# Patient Record
Sex: Female | Born: 1995
Health system: Southern US, Community
[De-identification: ages and names within clinical notes are randomized; demographics above are authoritative.]

## PROBLEM LIST (undated history)

## (undated) HISTORY — PX: TONSILLECTOMY: SUR1361

---

## 2015-04-28 ENCOUNTER — Emergency Department (HOSPITAL_BASED_OUTPATIENT_CLINIC_OR_DEPARTMENT_OTHER)
Admission: EM | Admit: 2015-04-28 | Discharge: 2015-04-28 | Disposition: A | Discharge status: Home / Self Care | Payer: Medicaid Other | Attending: Emergency Medicine | Admitting: Emergency Medicine

## 2015-04-28 ENCOUNTER — Encounter (HOSPITAL_BASED_OUTPATIENT_CLINIC_OR_DEPARTMENT_OTHER): Payer: Self-pay | Admitting: *Deleted

## 2015-04-28 DIAGNOSIS — O9989 Other specified diseases and conditions complicating pregnancy, childbirth and the puerperium: Secondary | ICD-10-CM | POA: Diagnosis not present

## 2015-04-28 DIAGNOSIS — O99112 Other diseases of the blood and blood-forming organs and certain disorders involving the immune mechanism complicating pregnancy, second trimester: Secondary | ICD-10-CM | POA: Diagnosis not present

## 2015-04-28 DIAGNOSIS — D72829 Elevated white blood cell count, unspecified: Secondary | ICD-10-CM | POA: Diagnosis not present

## 2015-04-28 DIAGNOSIS — R51 Headache: Secondary | ICD-10-CM | POA: Diagnosis not present

## 2015-04-28 DIAGNOSIS — R1030 Lower abdominal pain, unspecified: Secondary | ICD-10-CM | POA: Diagnosis not present

## 2015-04-28 DIAGNOSIS — O212 Late vomiting of pregnancy: Secondary | ICD-10-CM | POA: Diagnosis not present

## 2015-04-28 DIAGNOSIS — Z3A24 24 weeks gestation of pregnancy: Secondary | ICD-10-CM | POA: Insufficient documentation

## 2015-04-28 DIAGNOSIS — O2342 Unspecified infection of urinary tract in pregnancy, second trimester: Secondary | ICD-10-CM | POA: Diagnosis not present

## 2015-04-28 DIAGNOSIS — O26899 Other specified pregnancy related conditions, unspecified trimester: Secondary | ICD-10-CM

## 2015-04-28 DIAGNOSIS — R109 Unspecified abdominal pain: Secondary | ICD-10-CM

## 2015-04-28 DIAGNOSIS — R519 Headache, unspecified: Secondary | ICD-10-CM

## 2015-04-28 LAB — COMPREHENSIVE METABOLIC PANEL
ALT: 13 U/L — AB (ref 14–54)
AST: 16 U/L (ref 15–41)
Albumin: 3 g/dL — ABNORMAL LOW (ref 3.5–5.0)
Alkaline Phosphatase: 76 U/L (ref 38–126)
Anion gap: 6 (ref 5–15)
BUN: 8 mg/dL (ref 6–20)
CHLORIDE: 109 mmol/L (ref 101–111)
CO2: 24 mmol/L (ref 22–32)
CREATININE: 0.64 mg/dL (ref 0.44–1.00)
Calcium: 8.6 mg/dL — ABNORMAL LOW (ref 8.9–10.3)
GFR calc non Af Amer: >60 mL/min (ref >60)
Glucose, Bld: 100 mg/dL — ABNORMAL HIGH (ref 65–99)
Potassium: 3.6 mmol/L (ref 3.5–5.1)
SODIUM: 139 mmol/L (ref 135–145)
Total Bilirubin: 0.2 mg/dL — ABNORMAL LOW (ref 0.3–1.2)
Total Protein: 6.5 g/dL (ref 6.5–8.1)

## 2015-04-28 LAB — CBC WITH DIFFERENTIAL/PLATELET
BASOS ABS: 0 10*3/uL (ref 0.0–0.1)
BASOS PCT: 0 %
EOS ABS: 0.2 10*3/uL (ref 0.0–0.7)
EOS PCT: 1 %
HCT: 33 % — ABNORMAL LOW (ref 36.0–46.0)
Hemoglobin: 10.9 g/dL — ABNORMAL LOW (ref 12.0–15.0)
Lymphocytes Relative: 18 %
Lymphs Abs: 2.5 10*3/uL (ref 0.7–4.0)
MCH: 29 pg (ref 26.0–34.0)
MCHC: 33 g/dL (ref 30.0–36.0)
MCV: 87.8 fL (ref 78.0–100.0)
Monocytes Absolute: 0.6 10*3/uL (ref 0.1–1.0)
Monocytes Relative: 4 %
Neutro Abs: 10.7 10*3/uL — ABNORMAL HIGH (ref 1.7–7.7)
Neutrophils Relative %: 77 %
PLATELETS: 340 10*3/uL (ref 150–400)
RBC: 3.76 MIL/uL — AB (ref 3.87–5.11)
RDW: 13.2 % (ref 11.5–15.5)
WBC: 14 10*3/uL — AB (ref 4.0–10.5)

## 2015-04-28 LAB — URINALYSIS, ROUTINE W REFLEX MICROSCOPIC
Glucose, UA: NEGATIVE mg/dL
Hgb urine dipstick: NEGATIVE
Ketones, ur: 15 mg/dL — AB
NITRITE: NEGATIVE
PH: 6.5 (ref 5.0–8.0)
Protein, ur: NEGATIVE mg/dL
SPECIFIC GRAVITY, URINE: 1.029 (ref 1.005–1.030)

## 2015-04-28 LAB — URINE MICROSCOPIC-ADD ON: RBC / HPF: NONE SEEN RBC/hpf (ref 0–5)

## 2015-04-28 LAB — LIPASE, BLOOD: Lipase: 15 U/L (ref 11–51)

## 2015-04-28 MED ORDER — FOSFOMYCIN TROMETHAMINE 3 G PO PACK
3.0000 g | PACK | Freq: Once | ORAL | Status: AC
  Administered 2015-04-28: 3 g via ORAL
  Filled 2015-04-28: qty 3

## 2015-04-28 MED ORDER — ACETAMINOPHEN 325 MG PO TABS
650.0000 mg | ORAL_TABLET | Freq: Once | ORAL | Status: AC
  Administered 2015-04-28: 650 mg via ORAL
  Filled 2015-04-28: qty 2

## 2015-04-28 NOTE — ED Provider Notes (Signed)
CSN: [id number]     Arrival date & time 04/28/15  1846 History  By signing my name below, I, Bethel Born, attest that this documentation has been prepared under the direction and in the presence of Leta Baptist, MD. Electronically Signed: Bethel Born, ED Scribe. 04/28/2015. 9:04 PM  Chief Complaint  Patient presents with  . Abdominal Pain  . [redacted] weeks Pregnant    The history is provided by the patient. No language interpreter was used.   Shuntel [place] is a  20 y.o. G1 female who presents to the Emergency Department at approximately [redacted] weeks pregnant complaining of sharp, 9/10 in severity, mid abdominal pain with onset 2 days ago. Pt states that she had 1 episode of emesis yesterday.She has frontal headaches and mild intermittent lower abdominal cramping that have been ongoing throughout her pregnancy with no recent change.  Pt denies vaginal discharge, leakage of fluid,  fever, chills, change in urinary pattern, weakness, numbness, tingling, and vision change. Pt states that apart from 2 UTIs her pregnancy has been uncomplicated. She last saw her OB in [place] on 04/03/15 and has a scheduled appointment on 05/01/15.  History reviewed. No pertinent past medical history. Past Surgical History  Procedure Laterality Date  . Tonsillectomy     No family history on file. Social History  Substance Use Topics  . Smoking status: Never Smoker   . Smokeless tobacco: None  . Alcohol Use: No   OB History    Gravida Para Term Preterm AB TAB SAB Ectopic Multiple Living   1              Review of Systems  Eyes: Negative for visual disturbance.  Gastrointestinal: Positive for vomiting and abdominal pain.  Genitourinary: Negative for frequency, vaginal bleeding and vaginal discharge.  Neurological: Positive for headaches. Negative for weakness and numbness.  All other systems reviewed and are negative.  Allergies  Prozac Home Medications   Prior to Admission medications   Not on  File   BP 115/64 mmHg  Pulse 107  Temp(Src) 98.4 F (36.9 C) (Oral)  Resp 20  Ht 5\' 5"  (1.651 m)  Wt 260 lb (117.935 kg)  BMI 43.27 kg/m2  SpO2 99%  LMP 11/18/2014 Physical Exam  Constitutional: She is oriented to person, place, and time. She appears well-developed and well-nourished. No distress.  HENT:  Head: Normocephalic and atraumatic.  Eyes: EOM are normal.  Neck: Normal range of motion.  Cardiovascular: Normal rate, regular rhythm and normal heart sounds.   Pulmonary/Chest: Effort normal and breath sounds normal.  Abdominal: Soft. She exhibits no distension. There is tenderness. There is no rebound.  Gravid abdomen with some tenderness left of midline above the umbilicus. Abdomen otherwise soft without rebound.   Musculoskeletal: Normal range of motion.  Neurological: She is alert and oriented to person, place, and time.  Skin: Skin is warm and dry.  Psychiatric: She has a normal mood and affect. Judgment normal.  Nursing note and vitals reviewed.   ED Course  Procedures (including critical care time) DIAGNOSTIC STUDIES: Oxygen Saturation is 99% on RA,  normal by my interpretation.    COORDINATION OF CARE: 8:14 PM Discussed treatment plan which includes lab work and Tylenol with pt at bedside and pt agreed to plan.  Labs Review Labs Reviewed  URINALYSIS, ROUTINE W REFLEX MICROSCOPIC (NOT AT [facility]) - Abnormal; Notable for the following:    Color, Urine AMBER (*)    APPearance CLOUDY (*)    Bilirubin Urine  SMALL (*)    Ketones, ur 15 (*)    Leukocytes, UA SMALL (*)    All other components within normal limits  URINE MICROSCOPIC-ADD ON - Abnormal; Notable for the following:    Squamous Epithelial / LPF 6-30 (*)    Bacteria, UA RARE (*)    All other components within normal limits  CBC WITH DIFFERENTIAL/PLATELET - Abnormal; Notable for the following:    WBC 14.0 (*)    RBC 3.76 (*)    Hemoglobin 10.9 (*)    HCT 33.0 (*)    Neutro Abs 10.7 (*)    All other  components within normal limits  COMPREHENSIVE METABOLIC PANEL - Abnormal; Notable for the following:    Glucose, Bld 100 (*)    Calcium 8.6 (*)    Albumin 3.0 (*)    ALT 13 (*)    Total Bilirubin 0.2 (*)    All other components within normal limits  URINE CULTURE  LIPASE, BLOOD    Imaging Review No results found. I have personally reviewed and evaluated these lab results as part of my medical decision-making.   EKG Interpretation None      MDM  Patient was seen and evaluated in stable condition.  Relatively benign physical examination.  No significant tenderness and area where patient was tender was left side of the abdomen above the umbilicus.  Labs were unremarkable other than UA with small amount of bacteria, leukocytosis on CBC.  Patient was given Fosfomycin for her UA.  Patient was kept on fetal monitor for extended period of time with normal tracing and OB cleared the patient from that stand point.  Patient felt comfortable with plan for discharge and stated that she has a follow up appointment on Monday which she will keep.  Strict return precautions given including rightsided abdominal pain.  Patient was discharged home in stable condition. Final diagnoses:  Headache, unspecified headache type  Abdominal pain during pregnancy    1. Abdominal pain  2. Headache  I personally performed the services described in this documentation, which was scribed in my presence. The recorded information has been reviewed and is accurate.   Leta BaptistEmily Roe Nguyen, MD 04/29/15 2225

## 2015-04-28 NOTE — ED Notes (Signed)
Abdominal pain. [redacted] weeks pregnant. Headaches. No vaginal discharge or leakage.

## 2015-04-28 NOTE — Discharge Instructions (Signed)
You were seen today for your abdominal pain and headache. The heart rate strip of your baby looked good. Your baby looked good on bedside ultrasound. Your laboratory results showed some elevation of her white blood cell count but this is nonspecific. At this time it does not appear that anything serious is causing your abdominal pain. Try to use a bland diet. Follow-up with your OB/GYN on Monday. Return with sudden severe pain or new right-sided abdominal pain.  Abdominal Pain During Pregnancy Abdominal pain is common in pregnancy. Most of the time, it does not cause harm. There are many causes of abdominal pain. Some causes are more serious than others. Some of the causes of abdominal pain in pregnancy are easily diagnosed. Occasionally, the diagnosis takes time to understand. Other times, the cause is not determined. Abdominal pain can be a sign that something is very wrong with the pregnancy, or the pain may have nothing to do with the pregnancy at all. For this reason, always tell your health care provider if you have any abdominal discomfort. HOME CARE INSTRUCTIONS  Monitor your abdominal pain for any changes. The following actions may help to alleviate any discomfort you are experiencing:  Do not have sexual intercourse or put anything in your vagina until your symptoms go away completely.  Get plenty of rest until your pain improves.  Drink clear fluids if you feel nauseous. Avoid solid food as long as you are uncomfortable or nauseous.  Only take over-the-counter or prescription medicine as directed by your health care provider.  Keep all follow-up appointments with your health care provider. SEEK IMMEDIATE MEDICAL CARE IF:  You are bleeding, leaking fluid, or passing tissue from the vagina.  You have increasing pain or cramping.  You have persistent vomiting.  You have painful or bloody urination.  You have a fever.  You notice a decrease in your baby's movements.  You have  extreme weakness or feel faint.  You have shortness of breath, with or without abdominal pain.  You develop a severe headache with abdominal pain.  You have abnormal vaginal discharge with abdominal pain.  You have persistent diarrhea.  You have abdominal pain that continues even after rest, or gets worse. MAKE SURE YOU:   Understand these instructions.  Will watch your condition.  Will get help right away if you are not doing well or get worse.   This information is not intended to replace advice given to you by your health care provider. Make sure you discuss any questions you have with your health care provider.   Document Released: 04/01/2005 Document Revised: 01/20/2013 Document Reviewed: 10/29/2012 Elsevier Interactive Patient Education 2016 [employer]  General Headache  A headache is pain or discomfort felt around the head or neck area. The specific cause of a headache may not be found. There are many causes and types of headaches. A few common ones are:  Tension headaches.  Migraine headaches.  Cluster headaches.  Chronic daily headaches. HOME CARE INSTRUCTIONS  Watch your condition for any changes. Take these steps to help with your condition: Managing Pain  Take over-the-counter and prescription medicines only as told by your health care provider.  Lie down in a dark, quiet room when you have a headache.  If directed, apply ice to the head and neck area:  Put ice in a plastic bag.  Place a towel between your skin and the bag.  Leave the ice on for 20 minutes, 2-3 times per day.  Use a  heating pad or hot shower to apply heat to the head and neck area as told by your health care provider.  Keep lights dim if bright lights bother you or make your headaches worse. Eating and Drinking  Eat meals on a regular schedule.  Limit alcohol use.  Decrease the amount of caffeine you drink, or stop drinking caffeine. General Instructions  Keep all  follow-up visits as told by your health care provider. This is important.  Keep a headache journal to help find out what may trigger your headaches. For example, write down:  What you eat and drink.  How much sleep you get.  Any change to your diet or medicines.  Try massage or other relaxation techniques.  Limit stress.  Sit up straight, and do not tense your muscles.  Do not use tobacco products, including cigarettes, chewing tobacco, or e-cigarettes. If you need help quitting, ask your health care provider.  Exercise regularly as told by your health care provider.  Sleep on a regular schedule. Get 7-9 hours of sleep, or the amount recommended by your health care provider. SEEK MEDICAL CARE IF:   Your symptoms are not helped by medicine.  You have a headache that is different from the usual headache.  You have nausea or you vomit.  You have a fever. SEEK IMMEDIATE MEDICAL CARE IF:   Your headache becomes severe.  You have repeated vomiting.  You have a stiff neck.  You have a loss of vision.  You have problems with speech.  You have pain in the eye or ear.  You have muscular weakness or loss of muscle control.  You lose your balance or have trouble walking.  You feel faint or pass out.  You have confusion.   This information is not intended to replace advice given to you by your health care provider. Make sure you discuss any questions you have with your health care provider.   Document Released: 04/01/2005 Document Revised: 12/21/2014 Document Reviewed: 07/25/2014 Elsevier Interactive Patient Education [employer]

## 2015-04-28 NOTE — ED Notes (Signed)
Spoke with Olegario MessierKathy, Rapid response OB RN.  Pt can be removed from the toco and fetal monitor.

## 2015-05-02 LAB — URINE CULTURE

## 2015-05-29 ENCOUNTER — Encounter (HOSPITAL_BASED_OUTPATIENT_CLINIC_OR_DEPARTMENT_OTHER): Payer: Self-pay | Admitting: *Deleted

## 2015-05-29 ENCOUNTER — Emergency Department (HOSPITAL_BASED_OUTPATIENT_CLINIC_OR_DEPARTMENT_OTHER)
Admission: EM | Admit: 2015-05-29 | Discharge: 2015-05-29 | Disposition: A | Discharge status: Home / Self Care | Payer: Medicaid Other | Attending: Emergency Medicine | Admitting: Emergency Medicine

## 2015-05-29 DIAGNOSIS — R42 Dizziness and giddiness: Secondary | ICD-10-CM | POA: Diagnosis not present

## 2015-05-29 DIAGNOSIS — R1030 Lower abdominal pain, unspecified: Secondary | ICD-10-CM | POA: Diagnosis not present

## 2015-05-29 DIAGNOSIS — O9989 Other specified diseases and conditions complicating pregnancy, childbirth and the puerperium: Secondary | ICD-10-CM | POA: Insufficient documentation

## 2015-05-29 DIAGNOSIS — Z3A28 28 weeks gestation of pregnancy: Secondary | ICD-10-CM | POA: Insufficient documentation

## 2015-05-29 DIAGNOSIS — N898 Other specified noninflammatory disorders of vagina: Secondary | ICD-10-CM | POA: Insufficient documentation

## 2015-05-29 DIAGNOSIS — Z3493 Encounter for supervision of normal pregnancy, unspecified, third trimester: Secondary | ICD-10-CM

## 2015-05-29 DIAGNOSIS — R109 Unspecified abdominal pain: Secondary | ICD-10-CM

## 2015-05-29 LAB — URINALYSIS, ROUTINE W REFLEX MICROSCOPIC
Glucose, UA: NEGATIVE mg/dL
HGB URINE DIPSTICK: NEGATIVE
KETONES UR: 15 mg/dL — AB
NITRITE: NEGATIVE
PROTEIN: 30 mg/dL — AB
Specific Gravity, Urine: 1.028 (ref 1.005–1.030)
pH: 6 (ref 5.0–8.0)

## 2015-05-29 LAB — CBC WITH DIFFERENTIAL/PLATELET
BASOS ABS: 0 10*3/uL (ref 0.0–0.1)
Basophils Relative: 0 %
EOS PCT: 1 %
Eosinophils Absolute: 0.1 10*3/uL (ref 0.0–0.7)
HEMATOCRIT: 32.8 % — AB (ref 36.0–46.0)
Hemoglobin: 10.5 g/dL — ABNORMAL LOW (ref 12.0–15.0)
LYMPHS ABS: 2.3 10*3/uL (ref 0.7–4.0)
LYMPHS PCT: 17 %
MCH: 28.5 pg (ref 26.0–34.0)
MCHC: 32 g/dL (ref 30.0–36.0)
MCV: 89.1 fL (ref 78.0–100.0)
MONO ABS: 0.6 10*3/uL (ref 0.1–1.0)
Monocytes Relative: 5 %
NEUTROS ABS: 10.7 10*3/uL — AB (ref 1.7–7.7)
Neutrophils Relative %: 77 %
PLATELETS: 345 10*3/uL (ref 150–400)
RBC: 3.68 MIL/uL — ABNORMAL LOW (ref 3.87–5.11)
RDW: 12.9 % (ref 11.5–15.5)
WBC: 13.7 10*3/uL — ABNORMAL HIGH (ref 4.0–10.5)

## 2015-05-29 LAB — URINE MICROSCOPIC-ADD ON

## 2015-05-29 LAB — COMPREHENSIVE METABOLIC PANEL
ALT: 13 U/L — AB (ref 14–54)
AST: 14 U/L — AB (ref 15–41)
Albumin: 3 g/dL — ABNORMAL LOW (ref 3.5–5.0)
Alkaline Phosphatase: 91 U/L (ref 38–126)
Anion gap: 8 (ref 5–15)
BILIRUBIN TOTAL: 0.4 mg/dL (ref 0.3–1.2)
BUN: 8 mg/dL (ref 6–20)
CO2: 24 mmol/L (ref 22–32)
CREATININE: 0.66 mg/dL (ref 0.44–1.00)
Calcium: 8.8 mg/dL — ABNORMAL LOW (ref 8.9–10.3)
Chloride: 108 mmol/L (ref 101–111)
Glucose, Bld: 96 mg/dL (ref 65–99)
Potassium: 3.5 mmol/L (ref 3.5–5.1)
Sodium: 140 mmol/L (ref 135–145)
TOTAL PROTEIN: 6.7 g/dL (ref 6.5–8.1)

## 2015-05-29 NOTE — ED Notes (Signed)
OB rapid response called and made aware of the patient -

## 2015-05-29 NOTE — Progress Notes (Deleted)
1846  Received call regarding this 19yo G1P0 .[redacted]wks GA in with complaints of lower abdominal pressure and light vaginal discharge.  Patient receives OB care at [facility].  Patient arrived in ED @ 1737.

## 2015-05-29 NOTE — Progress Notes (Signed)
1846  Received call regarding this 19yo G1P0 .[redacted]wks GA in with complaints of lower abdominal pressure and light vaginal discharge.  Patient receives care at [facility].  Patient arrived in ED @ 1737.  1854  O[facility] RN advised ED RN FHR not tracing.  She will adjust.

## 2015-05-29 NOTE — ED Notes (Signed)
Patient on fetal heart monitor

## 2015-05-29 NOTE — ED Notes (Signed)
Lower abdominal pressure.she is [redacted] weeks pregnant. White vaginal discharge.

## 2015-05-29 NOTE — ED Notes (Signed)
Pt d/c home with note for work

## 2015-05-29 NOTE — Discharge Instructions (Signed)
Follow-up with your OB as scheduled in 3 days.  Return to the ER if you develop bleeding, worsening pain, high fever, or other new and concerning symptoms.  You do have a slight amount of protein in your urine. Please follow this up with your OB at your appointment.   Abdominal Pain, Adult Many things can cause abdominal pain. Usually, abdominal pain is not caused by a disease and will improve without treatment. It can often be observed and treated at home. Your health care provider will do a physical exam and possibly order blood tests and X-rays to help determine the seriousness of your pain. However, in many cases, more time must pass before a clear cause of the pain can be found. Before that point, your health care provider may not know if you need more testing or further treatment. HOME CARE INSTRUCTIONS Monitor your abdominal pain for any changes. The following actions may help to alleviate any discomfort you are experiencing:  Only take over-the-counter or prescription medicines as directed by your health care provider.  Do not take laxatives unless directed to do so by your health care provider.  Try a clear liquid diet (broth, tea, or water) as directed by your health care provider. Slowly move to a bland diet as tolerated. SEEK MEDICAL CARE IF:  You have unexplained abdominal pain.  You have abdominal pain associated with nausea or diarrhea.  You have pain when you urinate or have a bowel movement.  You experience abdominal pain that wakes you in the night.  You have abdominal pain that is worsened or improved by eating food.  You have abdominal pain that is worsened with eating fatty foods.  You have a fever. SEEK IMMEDIATE MEDICAL CARE IF:  Your pain does not go away within 2 hours.  You keep throwing up (vomiting).  Your pain is felt only in portions of the abdomen, such as the right side or the left lower portion of the abdomen.  You pass bloody or black tarry  stools. MAKE SURE YOU:  Understand these instructions.  Will watch your condition.  Will get help right away if you are not doing well or get worse.   This information is not intended to replace advice given to you by your health care provider. Make sure you discuss any questions you have with your health care provider.   Document Released: 01/09/2005 Document Revised: 12/21/2014 Document Reviewed: 12/09/2012 Elsevier Interactive Patient Education [employer]

## 2015-05-29 NOTE — ED Provider Notes (Signed)
CSN: [id number]     Arrival date & time 05/29/15  1737 History  By signing my name below, I, Linus Galas, attest that this documentation has been prepared under the direction and in the presence of Geoffery Lyons, MD. Electronically Signed: Linus Galas, ED Scribe. 05/29/2015. 5:59 PM.   Chief Complaint  Patient presents with  . Abdominal Pain   The history is provided by the patient. No language interpreter was used.   HPI Comments: Veronica Solis is a 20 y.o. female who is 28 weeks into her first pregnancy with no PMHx presents to the Emergency Department complaining of lower abdominal pain that began 4:30 PM, PTA. Pt also reports white vaginal discharge and lightheadedness. Pt denies any BLE swelling, vaginal bleeding or any other sx at this time. Pt denies any falls. Pt reports she last had an OB check up one month ago with no abnormalities.  History reviewed. No pertinent past medical history. Past Surgical History  Procedure Laterality Date  . Tonsillectomy     No family history on file. Social History  Substance Use Topics  . Smoking status: Never Smoker   . Smokeless tobacco: None  . Alcohol Use: No   OB History    Gravida Para Term Preterm AB TAB SAB Ectopic Multiple Living   1              Review of Systems  Gastrointestinal: Positive for abdominal pain.  Genitourinary: Positive for vaginal discharge (white). Negative for vaginal bleeding.  Neurological: Positive for light-headedness.  All other systems reviewed and are negative.   Allergies  Prozac  Home Medications   Prior to Admission medications   Not on File   BP 98/42 mmHg  Pulse 112  Temp(Src) 98.3 F (36.8 C) (Oral)  Resp 16  Ht  (1.651 m)  Wt 252 lb (114.306 kg)  BMI 41.93 kg/m2  SpO2 99%  LMP 11/18/2014   Physical Exam  Constitutional: She is oriented to person, place, and time. She appears well-developed and well-nourished. No distress.  HENT:  Head: Normocephalic and atraumatic.   Mouth/Throat: Oropharynx is clear and moist. No oropharyngeal exudate.  Eyes: Conjunctivae and EOM are normal. Pupils are equal, round, and reactive to light.  Neck: Normal range of motion. Neck supple.  No meningismus.  Cardiovascular: Normal rate, regular rhythm, normal heart sounds and intact distal pulses.   No murmur heard. Pulmonary/Chest: Effort normal and breath sounds normal. No respiratory distress.  Abdominal: Soft. There is no tenderness. There is no rebound and no guarding.  fundal height consistent with stated gestational age. Mild TTP in the suprapubic region.   Musculoskeletal: Normal range of motion. She exhibits no edema or tenderness.  Neurological: She is alert and oriented to person, place, and time. No cranial nerve deficit. She exhibits normal muscle tone. Coordination normal.  No ataxia on finger to nose bilaterally. No pronator drift. 5/5 strength throughout. CN 2-12 intact.Equal grip strength. Sensation intact.   Skin: Skin is warm.  Psychiatric: She has a normal mood and affect. Her behavior is normal.  Nursing note and vitals reviewed.   ED Course  Procedures  DIAGNOSTIC STUDIES: Oxygen Saturation is 99% on room air, normal by my interpretation.    COORDINATION OF CARE: 5:58 PM Will order blood work and urinalysis. Discussed treatment plan with pt at bedside and pt agreed to plan.  Labs Review Labs Reviewed - No data to display  Imaging Review No results found. I have personally reviewed and evaluated  these images and lab results as part of my medical decision-making.    MDM   Final diagnoses:  None    Patient is [redacted] weeks pregnant and presents with complaints of lower abdominal discomfort. She denies any bleeding or leakage of fluid. She has been observed on the monitor here and has no evidence for contractions. Urinalysis does reveal slight protein, however her blood pressures are within normal limits and there is no leg edema. She has an  appointment to see her OB in 3 days. I have advised her to mention this to her. She is to return as needed for any problems.  I personally performed the services described in this documentation, which was scribed in my presence. The recorded information has been reviewed and is accurate.       Geoffery Lyons, MD 05/29/15 2029

## 2015-05-29 NOTE — ED Notes (Signed)
Call to OB rapid response TOCO tracing has been WNL and has been review by their physician.

## 2015-07-30 ENCOUNTER — Emergency Department (HOSPITAL_BASED_OUTPATIENT_CLINIC_OR_DEPARTMENT_OTHER)
Admission: EM | Admit: 2015-07-30 | Discharge: 2015-07-30 | Disposition: A | Discharge status: Home / Self Care | Payer: Medicaid Other | Attending: Emergency Medicine | Admitting: Emergency Medicine

## 2015-07-30 ENCOUNTER — Encounter (HOSPITAL_BASED_OUTPATIENT_CLINIC_OR_DEPARTMENT_OTHER): Payer: Self-pay | Admitting: Emergency Medicine

## 2015-07-30 DIAGNOSIS — R21 Rash and other nonspecific skin eruption: Secondary | ICD-10-CM

## 2015-07-30 MED ORDER — ACETAMINOPHEN 325 MG PO TABS
ORAL_TABLET | ORAL | Status: AC
  Filled 2015-07-30: qty 1

## 2015-07-30 MED ORDER — ACETAMINOPHEN 325 MG PO TABS
650.0000 mg | ORAL_TABLET | Freq: Once | ORAL | Status: AC
  Administered 2015-07-30: 650 mg via ORAL
  Filled 2015-07-30: qty 2

## 2015-07-30 MED ORDER — HYDROCORTISONE 1 % EX CREA: 1.0000 "application " | TOPICAL_CREAM | Freq: Two times a day (BID) | CUTANEOUS | Status: AC

## 2015-07-30 NOTE — Discharge Instructions (Signed)
1. Medications: hydrocortisone craem, usual home medications 2. Treatment: rest, drink plenty of fluids 3. Follow Up: please followup with your OB tomorrow for discussion of your diagnoses and further evaluation after today's visit; if you do not have a primary care doctor use the phone number listed in your discharge paperwork to find one; please return to the ER for fever, increased redness or pain, new or worsening symptoms

## 2015-07-30 NOTE — ED Provider Notes (Signed)
CSN: [id number]     Arrival date & time 07/30/15  1900 History   First MD Initiated Contact with Patient 07/30/15 2028     Chief Complaint  Patient presents with  . Rash    HPI   Veronica Solis is a 20 y.o. female who presents to the ED with rash to the posterior aspect of her neck and upper back. She states she noticed this last night. She denies exacerbating or alleviating factors. She has not tried anything for symptom relief. She denies fever, chills, shortness of breath, abdominal pain, nausea, vomiting, vaginal bleeding. She denies history of similar symptoms. She denies new medications, foods, lotions, detergents, soap, environmental exposures. She states she is [redacted] weeks pregnant.   History reviewed. No pertinent past medical history. Past Surgical History  Procedure Laterality Date  . Tonsillectomy     History reviewed. No pertinent family history. Social History  Substance Use Topics  . Smoking status: Never Smoker   . Smokeless tobacco: None  . Alcohol Use: No   OB History    Gravida Para Term Preterm AB TAB SAB Ectopic Multiple Living   1               Review of Systems  Constitutional: Negative for fever and chills.  Respiratory: Negative for shortness of breath.   Skin: Positive for rash.      Allergies  Prozac  Home Medications   Prior to Admission medications   Not on File    BP 116/56 mmHg  Pulse 94  Temp(Src) 98.7 F (37.1 C) (Oral)  Resp 20  Ht 5\' 5"  (1.651 m)  Wt 116.574 kg  BMI 42.77 kg/m2  SpO2 100%  LMP 11/18/2014 Physical Exam  Constitutional: She is oriented to person, place, and time. She appears well-developed and well-nourished. No distress.  HENT:  Head: Normocephalic and atraumatic.  Right Ear: External ear normal.  Left Ear: External ear normal.  Nose: Nose normal.  Mouth/Throat: Uvula is midline, oropharynx is clear and moist and mucous membranes are normal.  Eyes: Conjunctivae, EOM and lids are normal. Pupils are equal,  round, and reactive to light. Right eye exhibits no discharge. Left eye exhibits no discharge. No scleral icterus.  Neck: Normal range of motion. Neck supple.  Cardiovascular: Normal rate, regular rhythm, normal heart sounds, intact distal pulses and normal pulses.   Pulmonary/Chest: Effort normal and breath sounds normal. No respiratory distress. She has no wheezes. She has no rales.  Abdominal: Soft. Normal appearance and bowel sounds are normal. She exhibits no distension and no mass. There is no tenderness. There is no rigidity, no rebound and no guarding.  Musculoskeletal: Normal range of motion. She exhibits no edema or tenderness.  Neurological: She is alert and oriented to person, place, and time.  Skin: Skin is warm, dry and intact. No rash noted. She is not diaphoretic. No erythema. No pallor.     Mild erythema to posterior aspect of neck and upper back. No skin sloughing. No significant heat. No fluctuance. No vesicles.  Psychiatric: She has a normal mood and affect. Her speech is normal and behavior is normal.  Nursing note and vitals reviewed.   ED Course  Procedures (including critical care time)  Labs Review Labs Reviewed - No data to display  Imaging Review No results found.    EKG Interpretation None      MDM   Final diagnoses:  Rash    [age] year old female presents with rash to the posterior aspect  of her neck, which she noticed yesterday. Denies fever, chills, shortness of breath, abdominal pain, nausea, vomiting, vaginal bleeding. Denies new exposures to foods, medications, lotions/soap/detergents. Patient is afebrile. Vital signs stable. On exam, she has mild erythema to the posterior aspect of her neck and upper back. No skin sloughing. No significant heat. No fluctuance. No vesicles. Patient discussed with Dr. Anitra Lauth. No evidence of cellulitis, abscess, shingles. Patient is nontoxic and well-appearing, feel she is stable for discharge at this time. Will  treat with topical hydrocortisone. Patient to follow up with her OB tomorrow. Strict return precautions discussed. Patient verbalizes her understanding and is in agreement with plan.  BP 116/56 mmHg  Pulse 94  Temp(Src) 98.7 F (37.1 C) (Oral)  Resp 20  Ht  (1.651 m)  Wt 116.574 kg  BMI 42.77 kg/m2  SpO2 100%  LMP 11/18/2014     Mady Gemma, PA-C 07/31/15 0454  Gwyneth Sprout, MD 08/02/15 1442

## 2015-07-30 NOTE — ED Notes (Signed)
Pt in c/o itchy red rash to posterior neck, denies new medications or products. Airway intact, NAD.

## 2016-03-02 ENCOUNTER — Encounter (HOSPITAL_COMMUNITY): Payer: Self-pay

## 2017-07-28 ENCOUNTER — Emergency Department (HOSPITAL_BASED_OUTPATIENT_CLINIC_OR_DEPARTMENT_OTHER)
Admission: EM | Admit: 2017-07-28 | Discharge: 2017-07-28 | Disposition: A | Discharge status: Home / Self Care | Payer: BLUE CROSS/BLUE SHIELD | Attending: Emergency Medicine | Admitting: Emergency Medicine

## 2017-07-28 ENCOUNTER — Other Ambulatory Visit: Payer: Self-pay

## 2017-07-28 ENCOUNTER — Encounter (HOSPITAL_BASED_OUTPATIENT_CLINIC_OR_DEPARTMENT_OTHER): Payer: Self-pay | Admitting: Emergency Medicine

## 2017-07-28 ENCOUNTER — Emergency Department (HOSPITAL_BASED_OUTPATIENT_CLINIC_OR_DEPARTMENT_OTHER): Payer: BLUE CROSS/BLUE SHIELD

## 2017-07-28 DIAGNOSIS — J111 Influenza due to unidentified influenza virus with other respiratory manifestations: Secondary | ICD-10-CM | POA: Diagnosis not present

## 2017-07-28 DIAGNOSIS — J029 Acute pharyngitis, unspecified: Secondary | ICD-10-CM | POA: Diagnosis present

## 2017-07-28 DIAGNOSIS — R6889 Other general symptoms and signs: Secondary | ICD-10-CM

## 2017-07-28 LAB — PREGNANCY, URINE: Preg Test, Ur: NEGATIVE

## 2017-07-28 LAB — URINALYSIS, ROUTINE W REFLEX MICROSCOPIC
Bilirubin Urine: NEGATIVE
Glucose, UA: NEGATIVE mg/dL
KETONES UR: 15 mg/dL — AB
LEUKOCYTES UA: NEGATIVE
NITRITE: NEGATIVE
Protein, ur: 30 mg/dL — AB
SPECIFIC GRAVITY, URINE: 1.025 (ref 1.005–1.030)
pH: 6 (ref 5.0–8.0)

## 2017-07-28 LAB — CBC WITH DIFFERENTIAL/PLATELET
Basophils Absolute: 0 10*3/uL (ref 0.0–0.1)
Basophils Relative: 0 %
Eosinophils Absolute: 0 10*3/uL (ref 0.0–0.7)
Eosinophils Relative: 0 %
HEMATOCRIT: 35.9 % — AB (ref 36.0–46.0)
Hemoglobin: 12.3 g/dL (ref 12.0–15.0)
LYMPHS ABS: 2 10*3/uL (ref 0.7–4.0)
LYMPHS PCT: 17 %
MCH: 28.9 pg (ref 26.0–34.0)
MCHC: 34.3 g/dL (ref 30.0–36.0)
MCV: 84.5 fL (ref 78.0–100.0)
Monocytes Absolute: 0.7 10*3/uL (ref 0.1–1.0)
Monocytes Relative: 6 %
NEUTROS ABS: 8.9 10*3/uL — AB (ref 1.7–7.7)
Neutrophils Relative %: 77 %
Platelets: 297 10*3/uL (ref 150–400)
RBC: 4.25 MIL/uL (ref 3.87–5.11)
RDW: 13.7 % (ref 11.5–15.5)
WBC: 11.6 10*3/uL — AB (ref 4.0–10.5)

## 2017-07-28 LAB — BASIC METABOLIC PANEL
ANION GAP: 8 (ref 5–15)
BUN: 12 mg/dL (ref 6–20)
CHLORIDE: 104 mmol/L (ref 101–111)
CO2: 22 mmol/L (ref 22–32)
Calcium: 8.4 mg/dL — ABNORMAL LOW (ref 8.9–10.3)
Creatinine, Ser: 0.89 mg/dL (ref 0.44–1.00)
GFR calc Af Amer: >60 mL/min (ref >60)
GLUCOSE: 150 mg/dL — AB (ref 65–99)
POTASSIUM: 3.4 mmol/L — AB (ref 3.5–5.1)
Sodium: 134 mmol/L — ABNORMAL LOW (ref 135–145)

## 2017-07-28 LAB — URINALYSIS, MICROSCOPIC (REFLEX)

## 2017-07-28 LAB — RAPID STREP SCREEN (MED CTR MEBANE ONLY): Streptococcus, Group A Screen (Direct): NEGATIVE

## 2017-07-28 MED ORDER — SODIUM CHLORIDE 0.9 % IV BOLUS
1000.0000 mL | Freq: Once | INTRAVENOUS | Status: AC
  Administered 2017-07-28: 1000 mL via INTRAVENOUS

## 2017-07-28 MED ORDER — KETOROLAC TROMETHAMINE 30 MG/ML IJ SOLN
30.0000 mg | Freq: Once | INTRAMUSCULAR | Status: AC
  Administered 2017-07-28: 30 mg via INTRAVENOUS

## 2017-07-28 MED ORDER — ACETAMINOPHEN 325 MG PO TABS
650.0000 mg | ORAL_TABLET | Freq: Once | ORAL | Status: AC | PRN
  Administered 2017-07-28: 650 mg via ORAL
  Filled 2017-07-28: qty 2

## 2017-07-28 MED ORDER — KETOROLAC TROMETHAMINE 30 MG/ML IJ SOLN
60.0000 mg | Freq: Once | INTRAMUSCULAR | Status: DC
  Filled 2017-07-28: qty 2

## 2017-07-28 NOTE — ED Notes (Signed)
Patient transported to X-ray 

## 2017-07-28 NOTE — ED Triage Notes (Addendum)
Pt c/o sore throat, HA, neck pain and chills since Saturday; Ibuprofen 400mg  at 1800

## 2017-07-28 NOTE — Discharge Instructions (Addendum)
Take tylenol and motrin around the clock to keep your fever and body aches down. Take over the counter cold and flu medications. Rest. Follow up as needed.

## 2017-07-28 NOTE — ED Provider Notes (Signed)
[facility] EMERGENCY DEPARTMENT Provider Note   CSN: [id number] Arrival date & time: 07/28/17  1947     History   Chief Complaint Chief Complaint  Patient presents with  . Sore Throat  . Neck Pain    HPI Veronica Solis is a 22 y.o. female.  HPI Veronica Solis is a 22 y.o. female presents to emergency department complaining of chills, headache, neck pain, cough, generalized weakness, some sore throat.  She states symptoms began yesterday.  She did not know she has a fever, temperature 103 here.  She does report neck pain but no neck stiffness.  She denies any photophobia.  She denies any nausea or vomiting.  No diarrhea.  No sick contacts.  No urinary symptoms.  She is currently on her menstrual cycle.  History reviewed. No pertinent past medical history.  There are no active problems to display for this patient.   Past Surgical History:  Procedure Laterality Date  . TONSILLECTOMY       OB History    Gravida  1   Para      Term      Preterm      AB      Living        SAB      TAB      Ectopic      Multiple      Live Births               Home Medications    Prior to Admission medications   Medication Sig Start Date End Date Taking? Authorizing Provider  hydrocortisone cream 1 % Apply 1 application topically 2 (two) times daily. Do not apply to face 07/30/15   Mady GemmaWestfall, Elizabeth C, PA-C    Family History No family history on file.  Social History Social History   Tobacco Use  . Smoking status: Never Smoker  . Smokeless tobacco: Never Used  Substance Use Topics  . Alcohol use: No  . Drug use: No     Allergies   Prozac [fluoxetine hcl]   Review of Systems Review of Systems  Constitutional: Positive for chills and fever.  HENT: Positive for sore throat. Negative for congestion.   Respiratory: Positive for cough. Negative for chest tightness and shortness of breath.   Cardiovascular: Negative for chest pain, palpitations  and leg swelling.  Gastrointestinal: Negative for abdominal pain, diarrhea, nausea and vomiting.  Genitourinary: Negative for dysuria, flank pain, pelvic pain, vaginal bleeding, vaginal discharge and vaginal pain.  Musculoskeletal: Positive for neck pain. Negative for arthralgias, myalgias and neck stiffness.  Skin: Negative for rash.  Neurological: Positive for weakness. Negative for dizziness and headaches.  All other systems reviewed and are negative.    Physical Exam Updated Vital Signs BP 133/90 (BP Location: Left Arm)   Pulse (!) 124   Temp (!) 103 F (39.4 C) (Oral)   Resp 18   Ht 5\' 6"  (1.676 m)   Wt (!) 146.5 kg (323 lb)   LMP 07/24/2017   SpO2 99%   BMI 52.13 kg/m   Physical Exam  Constitutional: She appears well-developed and well-nourished. No distress.  HENT:  Head: Normocephalic.  No nasal congestion.  Oropharynx is normal.  Eyes: Conjunctivae are normal.  Neck: Neck supple.  Full range of motion, able to touch chin to the chest.  Cardiovascular: Normal rate, regular rhythm and normal heart sounds.  Pulmonary/Chest: Effort normal and breath sounds normal. No respiratory distress. She has no wheezes. She  has no rales.  Abdominal: Soft. Bowel sounds are normal. She exhibits no distension. There is no tenderness. There is no rebound.  Musculoskeletal: She exhibits no edema.  Neurological: She is alert.  Skin: Skin is warm and dry.  Psychiatric: She has a normal mood and affect. Her behavior is normal.  Nursing note and vitals reviewed.    ED Treatments / Results  Labs (all labs ordered are listed, but only abnormal results are displayed) Labs Reviewed  CBC WITH DIFFERENTIAL/PLATELET - Abnormal; Notable for the following components:      Result Value   WBC 11.6 (*)    HCT 35.9 (*)    Neutro Abs 8.9 (*)    All other components within normal limits  BASIC METABOLIC PANEL - Abnormal; Notable for the following components:   Sodium 134 (*)    Potassium 3.4  (*)    Glucose, Bld 150 (*)    Calcium 8.4 (*)    All other components within normal limits  URINALYSIS, ROUTINE W REFLEX MICROSCOPIC - Abnormal; Notable for the following components:   Hgb urine dipstick TRACE (*)    Ketones, ur 15 (*)    Protein, ur 30 (*)    All other components within normal limits  URINALYSIS, MICROSCOPIC (REFLEX) - Abnormal; Notable for the following components:   Bacteria, UA FEW (*)    Squamous Epithelial / LPF 0-5 (*)    All other components within normal limits  RAPID STREP SCREEN ([facility] ONLY)  CULTURE, GROUP A STREP [facility])  PREGNANCY, URINE    EKG None  Radiology Dg Chest 2 View  Result Date: 07/28/2017 CLINICAL DATA:  Acute onset of fever, cough, headache and neck pain. EXAM: CHEST - 2 VIEW COMPARISON:  None. FINDINGS: The lungs are well-aerated and clear. There is no evidence of focal opacification, pleural effusion or pneumothorax. The heart is normal in size; the mediastinal contour is within normal limits. No acute osseous abnormalities are seen. IMPRESSION: No acute cardiopulmonary process seen. Electronically Signed   By: Roanna Raider M.D.   On: 07/28/2017 21:35    Procedures Procedures (including critical care time)  Medications Ordered in ED Medications  ketorolac (TORADOL) 30 MG/ML injection 60 mg (has no administration in time range)  acetaminophen (TYLENOL) tablet 650 mg (650 mg Oral Given 07/28/17 2007)     Initial Impression / Assessment and Plan / ED Course  I have reviewed the triage vital signs and the nursing notes.  Pertinent labs & imaging results that were available during my care of the patient were reviewed by me and considered in my medical decision making (see chart for details).     Patient with fever 103, generalized weakness, mild sore throat, oropharynx normal neck exam.  Lungs are clear.  No other infectious symptoms.  Tylenol given in triage.  Patient is complaining of a headache and neck pain, however on exam  no meningismus or neck stiffness.  Will get basic labs, will get chest x-ray and urinalysis.  10:38 PM White blood cell count slightly elevated 11.6.  Otherwise unremarkable labs.  Negative pregnancy test.  Unremarkable urine, normal chest x-ray, negative rapid strep.  Patient's heart rate down to 102, temperature improved to 99.3.  Patient's headache and neck pain completely resolved.  I suspect she probably has influenza versus another viral infection.  She is out of the window for Tamiflu.  Discussed discharge home with oral fluid hydration and Tylenol and Motrin around-the-clock to keep her fever down.  Discussed following up with family doctor.  Return precautions discussed.  Vitals:   07/28/17 1952 07/28/17 1956 07/28/17 2203  BP: 133/90  104/63  Pulse: (!) 124  (!) 102  Resp: 18  20  Temp: (!) 103 F (39.4 C)  99.3 F (37.4 C)  TempSrc: Oral  Oral  SpO2: 99%  98%  Weight:  (!) 146.5 kg (323 lb)   Height:  5\' 6"  (1.676 m)      Final Clinical Impressions(s) / ED Diagnoses   Final diagnoses:  Flu-like symptoms    ED Discharge Orders    None       Iona Coach 07/28/17 2246    Tilden Fossa, MD 07/29/17 [phone]

## 2017-07-31 LAB — CULTURE, GROUP A STREP (THRC)

## 2017-08-28 ENCOUNTER — Other Ambulatory Visit: Payer: Self-pay

## 2017-08-28 ENCOUNTER — Emergency Department (HOSPITAL_BASED_OUTPATIENT_CLINIC_OR_DEPARTMENT_OTHER)
Admission: EM | Admit: 2017-08-28 | Discharge: 2017-08-28 | Disposition: A | Discharge status: Home / Self Care | Payer: BLUE CROSS/BLUE SHIELD | Attending: Emergency Medicine | Admitting: Emergency Medicine

## 2017-08-28 ENCOUNTER — Emergency Department (HOSPITAL_BASED_OUTPATIENT_CLINIC_OR_DEPARTMENT_OTHER): Payer: BLUE CROSS/BLUE SHIELD

## 2017-08-28 ENCOUNTER — Encounter (HOSPITAL_BASED_OUTPATIENT_CLINIC_OR_DEPARTMENT_OTHER): Payer: Self-pay | Admitting: *Deleted

## 2017-08-28 DIAGNOSIS — B9789 Other viral agents as the cause of diseases classified elsewhere: Secondary | ICD-10-CM | POA: Diagnosis not present

## 2017-08-28 DIAGNOSIS — J069 Acute upper respiratory infection, unspecified: Secondary | ICD-10-CM | POA: Insufficient documentation

## 2017-08-28 DIAGNOSIS — R05 Cough: Secondary | ICD-10-CM | POA: Diagnosis present

## 2017-08-28 MED ORDER — GUAIFENESIN-CODEINE 100-10 MG/5ML PO SYRP: 5.0000 mL | ORAL_SOLUTION | Freq: Three times a day (TID) | ORAL | 0 refills | Status: AC | PRN

## 2017-08-28 MED ORDER — BENZONATATE 100 MG PO CAPS
100.0000 mg | ORAL_CAPSULE | Freq: Three times a day (TID) | ORAL | 0 refills | Status: AC
Start: 2017-08-28 — End: ?

## 2017-08-28 MED FILL — GUAIATUSSIN AC LIQUID: 100-10 | 8 days supply | Qty: 120 | Fill #0

## 2017-08-28 MED FILL — BENZONATATE 100 MG CAPSULE: 100 | 7 days supply | Qty: 21 | Fill #0

## 2017-08-28 NOTE — ED Triage Notes (Signed)
Pt amb to triage with quick steady gait in nad. Pt reports cough x last weekend, non productive, denies fevers, had nasal congestion but that has resolved.

## 2017-08-28 NOTE — Discharge Instructions (Signed)
Your chest x-ray was reassuring.  No pneumonia.  Please take cough medicine as prescribed.  Remember that Robitussin with codeine can make you drowsy so please do not drive or work while taking it.  Return to the ER if you have worsening trouble breathing, chest pain outside of cough, fever greater than 100.4 F.

## 2017-08-28 NOTE — ED Provider Notes (Signed)
[facility] EMERGENCY DEPARTMENT Provider Note   CSN: [id number] Arrival date & time: 08/28/17  1156     History   Chief Complaint Chief Complaint  Patient presents with  . Cough    HPI Veronica Solis is a 22 y.o. female.  HPI  Veronica Solis is a 21yo female with no significant past medical history who presents emergency department for evaluation of cough.  Patient reports that cough began about 10 days ago.  States she was initially coughing up mucus, but now it is more of a dry and hacking cough.  Endorses anterior chest wall pain with cough only.  States that she feels short of breath with coughing.  She denies shortness of breath at rest.  She tried taking some over-the-counter TheraFlu and Tylenol without significant improvement.  She also reports some congestion and wheezing.  Denies history of asthma, has never had to use an inhaler.  She denies fevers, chills, ear pain, sore throat, headache, neck stiffness, leg swelling, abdominal pain, nausea/vomiting.  Has a daughter at home who recently had a URI.  History reviewed. No pertinent past medical history.  There are no active problems to display for this patient.   Past Surgical History:  Procedure Laterality Date  . TONSILLECTOMY       OB History    Gravida  1   Para      Term      Preterm      AB      Living        SAB      TAB      Ectopic      Multiple      Live Births               Home Medications    Prior to Admission medications   Medication Sig Start Date End Date Taking? Authorizing Provider  hydrocortisone cream 1 % Apply 1 application topically 2 (two) times daily. Do not apply to face 07/30/15   Doran Stabler    Family History History reviewed. No pertinent family history.  Social History Social History   Tobacco Use  . Smoking status: Never Smoker  . Smokeless tobacco: Never Used  Substance Use Topics  . Alcohol use: No  . Drug use: No      Allergies   Prozac [fluoxetine hcl]   Review of Systems Review of Systems  Constitutional: Negative for chills and fever.  HENT: Positive for congestion. Negative for ear pain and sore throat.   Respiratory: Positive for cough, shortness of breath and wheezing.   Cardiovascular: Positive for chest pain (with cough). Negative for leg swelling.  Gastrointestinal: Negative for abdominal pain, nausea and vomiting.  Musculoskeletal: Negative for neck pain and neck stiffness.  Neurological: Negative for headaches.     Physical Exam Updated Vital Signs BP 118/64 (BP Location: Right Arm)   Pulse 86   Temp 98.2 F (36.8 C) (Oral)   Resp 18   Ht  (1.676 m)   Wt (!) 142 kg (313 lb)   LMP 08/28/2017   SpO2 100%   BMI 50.52 kg/m   Physical Exam  Constitutional: She appears well-developed and well-nourished. No distress.  No distress.  Nontoxic-appearing.  HENT:  Head: Normocephalic and atraumatic.  Mouth/Throat: Oropharynx is clear and moist. No oropharyngeal exudate.  Eyes: Pupils are equal, round, and reactive to light. Conjunctivae are normal. Right eye exhibits no discharge. Left eye exhibits no discharge.  Neck:  Normal range of motion. Neck supple.  Cardiovascular: Normal rate and regular rhythm. Exam reveals no friction rub.  No murmur heard. Pulmonary/Chest: Effort normal and breath sounds normal. No stridor. No respiratory distress. She has no wheezes. She has no rales.  No respiratory distress, speaking in full sentences.  Lungs clear to auscultation.  Musculoskeletal:  No leg swelling.  Neurological: She is alert. Coordination normal.  Skin: She is not diaphoretic.  Psychiatric: She has a normal mood and affect. Her behavior is normal.  Nursing note and vitals reviewed.    ED Treatments / Results  Labs (all labs ordered are listed, but only abnormal results are displayed) Labs Reviewed - No data to display  EKG None  Radiology No results  found.  Procedures Procedures (including critical care time)  Medications Ordered in ED Medications - No data to display   Initial Impression / Assessment and Plan / ED Course  I have reviewed the triage vital signs and the nursing notes.  Pertinent labs & imaging results that were available during my care of the patient were reviewed by me and considered in my medical decision making (see chart for details).     Pt CXR negative for acute infiltrate. Patients symptoms of cough and congestion are consistent with URI, likely viral etiology. Discussed that antibiotics are not indicated for viral infections. Pt will be discharged with symptomatic treatment.  She verbalizes understanding and is agreeable with plan. Pt is hemodynamically stable & in NAD prior to dc.  Final Clinical Impressions(s) / ED Diagnoses   Final diagnoses:  Viral URI with cough    ED Discharge Orders        Ordered    guaiFENesin-codeine [facility] AC) 100-10 MG/5ML syrup  3 times daily PRN     08/28/17 1413    benzonatate (TESSALON) 100 MG capsule  Every 8 hours     08/28/17 1413       Kellie Shropshire, PA-C 08/28/17 1426    Tegeler, Canary Brim, MD 08/28/17 [phone]

## 2019-09-28 IMAGING — DX DG CHEST 2V
2 series · 2 of 2 positions shown · non-contrast
Comparison: 07/28/2017

CLINICAL DATA: Pt reports cough x last weekend, non productive,
denies fevers, had nasal congestion but that has resolved. HX: SOB.
Shielded

EXAM:
CHEST - 2 VIEW

[chest pa]
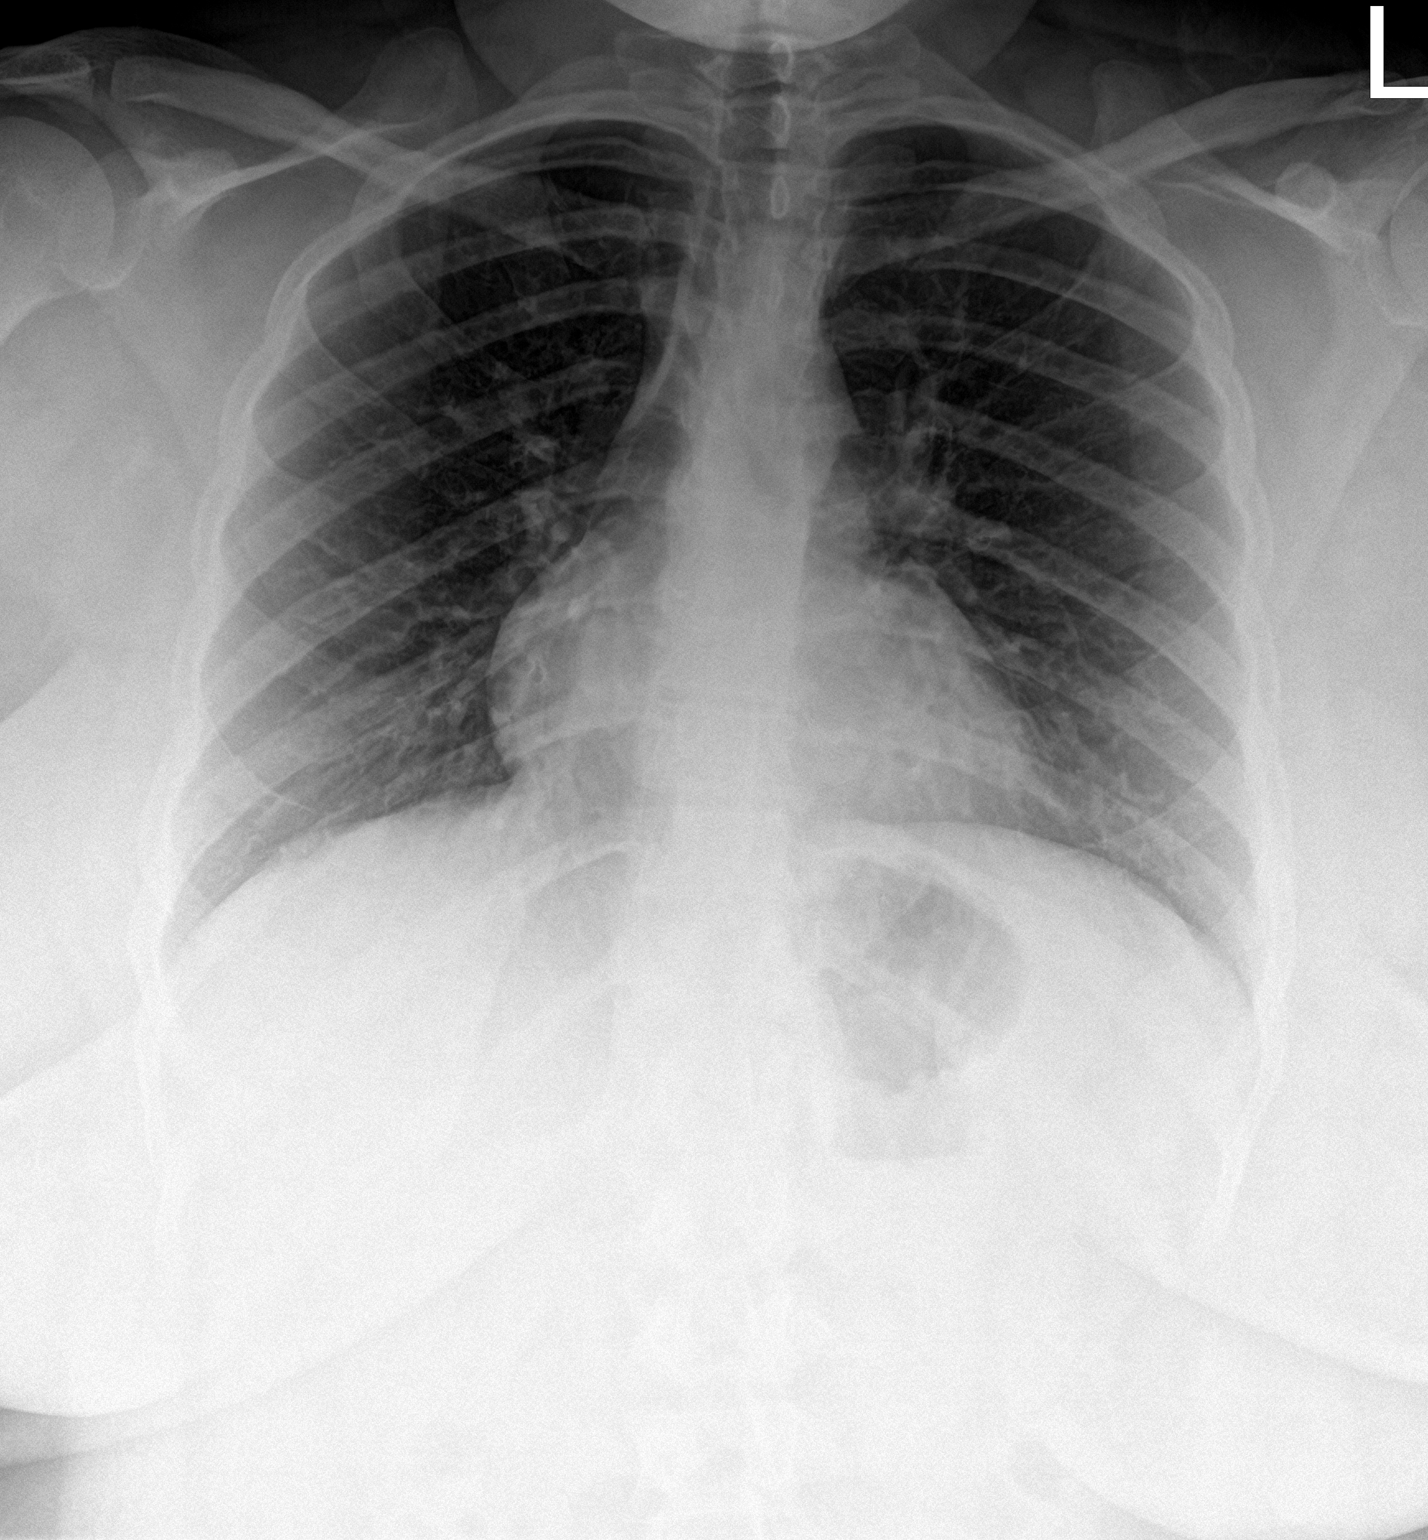

[chest lat]
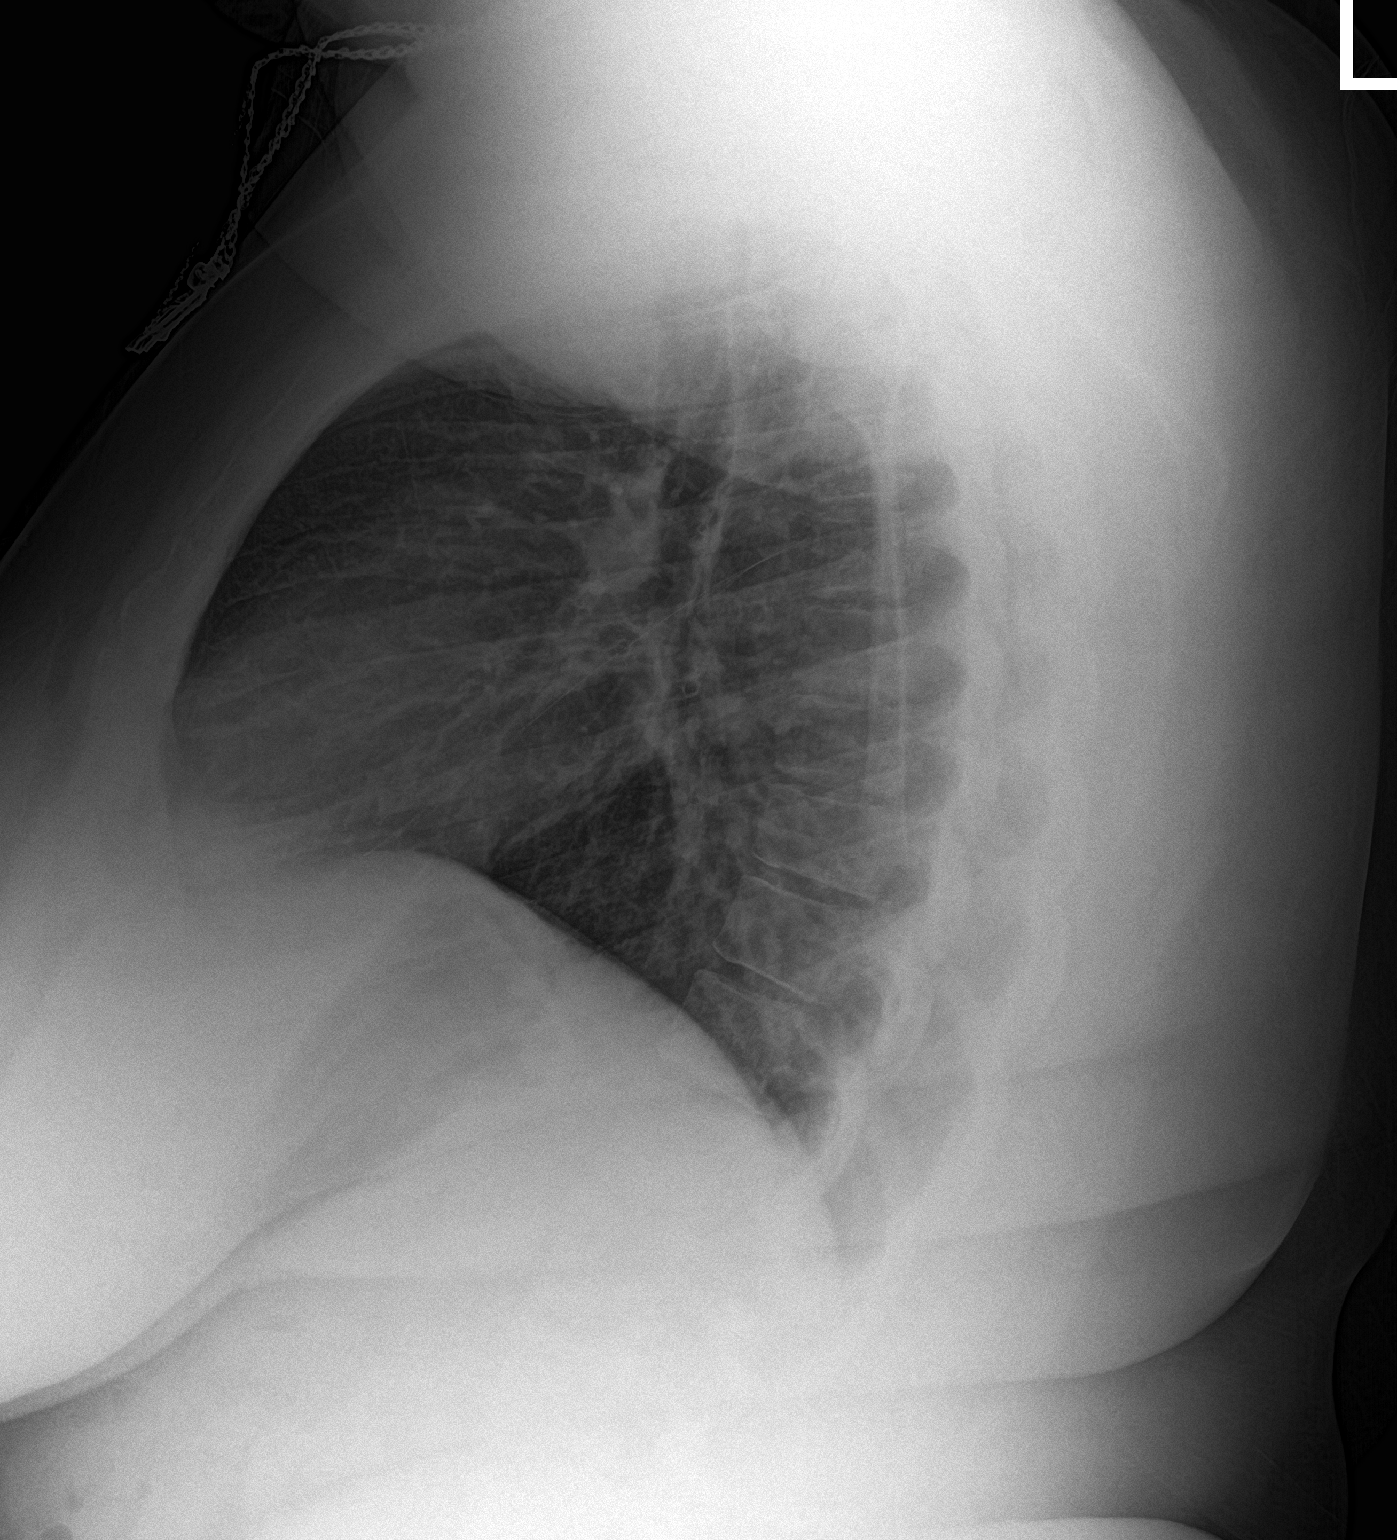

[2 of 2 positions shown; findings below may reference images not displayed]

FINDINGS: The heart size and mediastinal contours are within normal limits.
Both lungs are clear. The visualized skeletal structures are
unremarkable.
IMPRESSION: No active cardiopulmonary disease.

## 2021-06-06 ENCOUNTER — Encounter (HOSPITAL_BASED_OUTPATIENT_CLINIC_OR_DEPARTMENT_OTHER): Payer: Self-pay | Admitting: *Deleted

## 2021-06-06 ENCOUNTER — Emergency Department (HOSPITAL_BASED_OUTPATIENT_CLINIC_OR_DEPARTMENT_OTHER)
Admission: EM | Admit: 2021-06-06 | Discharge: 2021-06-06 | Disposition: A | Discharge status: Home / Self Care | Payer: BC Managed Care – PPO | Attending: Emergency Medicine | Admitting: Emergency Medicine

## 2021-06-06 ENCOUNTER — Other Ambulatory Visit: Payer: Self-pay

## 2021-06-06 DIAGNOSIS — J020 Streptococcal pharyngitis: Secondary | ICD-10-CM | POA: Insufficient documentation

## 2021-06-06 DIAGNOSIS — Z20822 Contact with and (suspected) exposure to covid-19: Secondary | ICD-10-CM | POA: Diagnosis not present

## 2021-06-06 DIAGNOSIS — J029 Acute pharyngitis, unspecified: Secondary | ICD-10-CM | POA: Diagnosis present

## 2021-06-06 LAB — RESP PANEL BY RT-PCR (FLU A&B, COVID) ARPGX2
Influenza A by PCR: NEGATIVE
Influenza B by PCR: NEGATIVE
SARS Coronavirus 2 by RT PCR: NEGATIVE

## 2021-06-06 LAB — GROUP A STREP BY PCR: Group A Strep by PCR: DETECTED — AB

## 2021-06-06 MED ORDER — PENICILLIN G BENZATHINE 1200000 UNIT/2ML IM SUSY
1.2000 10*6.[IU] | PREFILLED_SYRINGE | Freq: Once | INTRAMUSCULAR | Status: AC
  Administered 2021-06-06: 1.2 10*6.[IU] via INTRAMUSCULAR
  Filled 2021-06-06: qty 2

## 2021-06-06 MED ORDER — DEXAMETHASONE SODIUM PHOSPHATE 10 MG/ML IJ SOLN
10.0000 mg | Freq: Once | INTRAMUSCULAR | Status: AC
  Administered 2021-06-06: 10 mg via INTRAMUSCULAR
  Filled 2021-06-06: qty 1

## 2021-06-06 NOTE — ED Provider Notes (Signed)
[facility] EMERGENCY DEPARTMENT Provider Note   CSN: [id number] Arrival date & time: 06/06/21  1219     History  Chief Complaint  Patient presents with   Sore Throat    Veronica Solis is a 26 y.o. female presenting with a complaint of sore throat and dysphagia since Sunday.  She reports that her kids tested positive for strep at the end of last week.  Denies any trouble breathing, is still eating however only soft foods.  Denies any fevers.  Reports only an occasional cough.  No medication allergies.    Home Medications Prior to Admission medications   Medication Sig Start Date End Date Taking? Authorizing Provider  benzonatate (TESSALON) 100 MG capsule Take 1 capsule (100 mg total) by mouth every 8 (eight) hours. 08/28/17   Kellie Shropshire, PA-C  guaiFENesin-codeine (ROBITUSSIN AC) 100-10 MG/5ML syrup Take 5 mLs by mouth 3 (three) times daily as needed for cough. 08/28/17   Kellie Shropshire, PA-C  hydrocortisone cream 1 % Apply 1 application topically 2 (two) times daily. Do not apply to face 07/30/15   Mady Gemma, PA-C      Allergies    Prozac [fluoxetine hcl]    Review of Systems   Review of Systems See HPI  Physical Exam Updated Vital Signs BP (!) 124/100 (BP Location: Right Arm)    Pulse 78    Temp 97.9 F (36.6 C) (Oral)    Resp 20    Ht 5\' 6"  (1.676 m)    Wt (!) 142 kg    SpO2 95%    BMI 50.53 kg/m  Physical Exam Vitals and nursing note reviewed.  Constitutional:      General: She is not in acute distress.    Appearance: Normal appearance. She is not ill-appearing.  HENT:     Head: Normocephalic and atraumatic.     Mouth/Throat:     Mouth: Mucous membranes are dry.     Pharynx: Pharyngeal swelling and oropharyngeal exudate present.     Tonsils: Tonsillar exudate present. 1+ on the right. 1+ on the left.     Comments: Inflammation and exudate to tonsils and uvula Eyes:     General: No scleral icterus.    Conjunctiva/sclera: Conjunctivae  normal.  Pulmonary:     Effort: Pulmonary effort is normal. No respiratory distress.  Musculoskeletal:     Cervical back: Normal range of motion.  Lymphadenopathy:     Cervical: No cervical adenopathy.  Skin:    Findings: No rash.  Neurological:     Mental Status: She is alert.  Psychiatric:        Mood and Affect: Mood normal.    ED Results / Procedures / Treatments   Labs (all labs ordered are listed, but only abnormal results are displayed) Labs Reviewed  GROUP A STREP BY PCR - Abnormal; Notable for the following components:      Result Value   Group A Strep by PCR DETECTED (*)    All other components within normal limits  RESP PANEL BY RT-PCR (FLU A&B, COVID) ARPGX2    EKG None  Radiology No results found.  Procedures Procedures   Medications Ordered in ED Medications  dexamethasone (DECADRON) injection 10 mg (has no administration in time range)  penicillin g benzathine (BICILLIN LA) 1200000 UNIT/2ML injection 1.2 Million Units (has no administration in time range)    ED Course/ Medical Decision Making/ A&P  Medical Decision Making Risk Prescription drug management.   78-year-old female presenting with a sore throat.  Known strep exposure from her daughter.  Similar symptoms.  Protecting airway, tolerating secretions.  Swabs positive for strep.  Given Decadron and penicillin injections in the department today.  Discharged in stable condition. Final Clinical Impression(s) / ED Diagnoses Final diagnoses:  Strep throat    Rx / DC Orders Results and diagnoses were explained to the patient. Return precautions discussed in full. Patient had no additional questions and expressed complete understanding.   This chart was dictated using voice recognition software.  Despite best efforts to proofread,  errors can occur which can change the documentation meaning.    Saddie Benders, PA-C 06/06/21 1310    Virgina Norfolk,  DO 06/06/21 1311

## 2021-06-06 NOTE — ED Triage Notes (Signed)
States this past Sunday began having a sorethroat, which radiates to her rt hear, very little cough associated with complaints, unsure if been having any fevers. Does has pain upon swallowing also has noted a very dry mouth

## 2021-06-06 NOTE — ED Notes (Signed)
Client states her daughter at home as strep throat and bilateral otitis media

## 2023-01-17 ENCOUNTER — Encounter (HOSPITAL_BASED_OUTPATIENT_CLINIC_OR_DEPARTMENT_OTHER): Payer: Self-pay | Admitting: Pediatrics

## 2023-01-17 ENCOUNTER — Emergency Department (HOSPITAL_BASED_OUTPATIENT_CLINIC_OR_DEPARTMENT_OTHER)
Admission: EM | Admit: 2023-01-17 | Discharge: 2023-01-17 | Disposition: A | Discharge status: Home / Self Care | Payer: BC Managed Care – PPO | Attending: Emergency Medicine | Admitting: Emergency Medicine

## 2023-01-17 ENCOUNTER — Other Ambulatory Visit: Payer: Self-pay

## 2023-01-17 DIAGNOSIS — H9201 Otalgia, right ear: Secondary | ICD-10-CM | POA: Diagnosis present

## 2023-01-17 DIAGNOSIS — H60311 Diffuse otitis externa, right ear: Secondary | ICD-10-CM | POA: Diagnosis not present

## 2023-01-17 MED ORDER — CIPROFLOXACIN-DEXAMETHASONE 0.3-0.1 % OT SUSP: 4.0000 [drp] | Freq: Two times a day (BID) | OTIC | 0 refills | Status: AC

## 2023-01-17 MED ORDER — IBUPROFEN 800 MG PO TABS
800.0000 mg | ORAL_TABLET | Freq: Once | ORAL | Status: AC
  Administered 2023-01-17: 800 mg via ORAL
  Filled 2023-01-17: qty 1

## 2023-01-17 MED ORDER — AMOXICILLIN-POT CLAVULANATE 875-125 MG PO TABS
1.0000 | ORAL_TABLET | Freq: Two times a day (BID) | ORAL | 0 refills | Status: AC
Start: 2023-01-17 — End: 2023-01-22

## 2023-01-17 NOTE — Discharge Instructions (Addendum)
You may have an ear infection.  You have been prescribed eardrops and an oral antibiotic.  Please take the full course of these antibiotics as prescribed.  Follow-up with your PCP within the next week for recheck.  You have been prescribed Ciprodex eardrops.  Place 2 drops in your right ear 2 times a day times a day for the next 7 days.  You have been prescribed Augmentin. Take this antibiotic 2 times a day for the next 5 days. Take the full course of your antibiotic even if you start feeling better. Antibiotics may cause you to have diarrhea.   You may use up to 800mg  ibuprofen every 8 hours as needed for pain.  Do not exceed 2.4g of ibuprofen per day.  You may take up to 1000mg  of tylenol every 6 hours as needed for pain.  Do not take more then 4g per day.  Return to the ER for any uncontrolled fever, severe pain, any other new or concerning symptoms.

## 2023-01-17 NOTE — ED Provider Notes (Signed)
[place] [facility]ERGENCY DEPARTMENT AT [facility] Provider Note   CSN: [id number] Arrival date & time: 01/17/23  1059     History  Chief Complaint  Patient presents with   Otalgia    Veronica Solis is a 27 y.o. female who presents with concern for right-sided ear pain that started 2 days ago.  Denies any discharge from the ear.  Denies any fever or chills at home.  Denies any recent illnesses.  Denies any trauma to the area.   Otalgia      Home Medications Prior to Admission medications   Medication Sig Start Date End Date Taking? Authorizing Provider  amoxicillin-clavulanate (AUGMENTIN) 875-125 MG tablet Take 1 tablet by mouth every 12 (twelve) hours for 5 days. 01/17/23 01/22/23 Yes Arabella Merles, PA-C  ciprofloxacin-dexamethasone (CIPRODEX) OTIC suspension Place 4 drops into the right ear 2 (two) times daily for 7 days. 01/17/23 01/24/23 Yes Arabella Merles, PA-C  benzonatate (TESSALON) 100 MG capsule Take 1 capsule (100 mg total) by mouth every 8 (eight) hours. 08/28/17   Kellie Shropshire, PA-C  guaiFENesin-codeine (ROBITUSSIN AC) 100-10 MG/5ML syrup Take 5 mLs by mouth 3 (three) times daily as needed for cough. 08/28/17   Kellie Shropshire, PA-C  hydrocortisone cream 1 % Apply 1 application topically 2 (two) times daily. Do not apply to face 07/30/15   Mady Gemma, PA-C      Allergies    Prozac [fluoxetine hcl]    Review of Systems   Review of Systems  HENT:  Positive for ear pain.     Physical Exam Updated Vital Signs BP 138/89 (BP Location: Left Arm)   Pulse 95   Temp 97.9 F (36.6 C) (Oral)   Resp 16   Ht 5\' 5"  (1.651 m)   Wt (!) 140.2 kg   SpO2 99%   BMI 51.42 kg/m  Physical Exam Vitals and nursing note reviewed.  Constitutional:      Appearance: Normal appearance.     Comments: Well-appearing, no acute distress  HENT:     Head: Atraumatic.     Ears:     Comments: Right external ear canal is slightly edematous with earwax or  debris blocking full view of TM.  Tenderness to pulling of the ear on the right, no tenderness to pulling the ear on the left  Left ear canal and TM healthy appearing  No mastoid tenderness bilaterally Cardiovascular:     Rate and Rhythm: Normal rate and regular rhythm.  Pulmonary:     Effort: Pulmonary effort is normal.  Neurological:     General: No focal deficit present.     Mental Status: She is alert.  Psychiatric:        Mood and Affect: Mood normal.        Behavior: Behavior normal.     ED Results / Procedures / Treatments   Labs (all labs ordered are listed, but only abnormal results are displayed) Labs Reviewed - No data to display  EKG None  Radiology No results found.  Procedures Procedures    Medications Ordered in ED Medications  ibuprofen (ADVIL) tablet 800 mg (has no administration in time range)    ED Course/ Medical Decision Making/ A&P                                 Medical Decision Making  27 y.o. female with no significant past medical history presents to  the ED for concern of right ear pain  Differential diagnosis includes but is not limited to otitis externa, otitis media, mastoiditis  ED Course:  Patient with right ear pain for 2 days, no mastoid tenderness, no vital sign abnormalities.  On exam of the right ear, tender to pulling of the right ear and does have some edema of the external ear canal.  There is some debris in the ear which makes it hard to visualize the TM fully.  Will treat for possible otitis media with Augmentin, and no Trezix external with Ciprodex drops.  I discussed with patient that she needs to follow-up with her PCP in the next week to have a reevaluation of her ear.  She verbalizes understanding.  Appropriate for discharge home at this time. Patient given 800 mg ibuprofen here for pain   Impression: Right ear otitis externa  Disposition:  The patient was discharged home with instructions to use Ciprodex drops as  prescribed, amoxicillin as prescribed, follow-up with PCP in 1 week for symptom recheck.  Ibuprofen and Tylenol as needed for pain. Return precautions given.            Final Clinical Impression(s) / ED Diagnoses Final diagnoses:  Acute diffuse otitis externa of right ear    Rx / DC Orders ED Discharge Orders          Ordered    amoxicillin-clavulanate (AUGMENTIN) 875-125 MG tablet  Every 12 hours        01/17/23 1310    ciprofloxacin-dexamethasone (CIPRODEX) OTIC suspension  2 times daily        01/17/23 1310              Arabella Merles, PA-C 01/17/23 1316    Benjiman Core, MD 01/17/23 1452

## 2023-01-17 NOTE — ED Triage Notes (Signed)
C/O right sided ear pain x 2 days.
